# Patient Record
Sex: Male | Born: 1965 | Hispanic: No | Marital: Single | Smoking: Never smoker
Health system: Southern US, Community
[De-identification: ages and names within clinical notes are randomized; demographics above are authoritative.]

## PROBLEM LIST (undated history)

## (undated) HISTORY — PX: ABDOMINAL SURGERY: SHX537

## (undated) HISTORY — PX: OTHER SURGICAL HISTORY: SHX169

---

## 1999-05-31 ENCOUNTER — Emergency Department (HOSPITAL_COMMUNITY): Admission: EM | Admit: 1999-05-31 | Discharge: 1999-05-31 | Payer: Self-pay

## 1999-07-08 ENCOUNTER — Emergency Department (HOSPITAL_COMMUNITY): Admission: EM | Admit: 1999-07-08 | Discharge: 1999-07-08 | Payer: Self-pay | Admitting: Emergency Medicine

## 1999-07-08 ENCOUNTER — Encounter: Payer: Self-pay | Admitting: Emergency Medicine

## 2000-11-18 ENCOUNTER — Encounter: Payer: Self-pay | Admitting: Emergency Medicine

## 2000-11-18 ENCOUNTER — Emergency Department (HOSPITAL_COMMUNITY): Admission: EM | Admit: 2000-11-18 | Discharge: 2000-11-18 | Payer: Self-pay | Admitting: Emergency Medicine

## 2000-11-24 ENCOUNTER — Encounter: Admission: RE | Admit: 2000-11-24 | Discharge: 2000-11-24 | Payer: Self-pay | Admitting: Family Medicine

## 2000-11-25 ENCOUNTER — Emergency Department (HOSPITAL_COMMUNITY): Admission: EM | Admit: 2000-11-25 | Discharge: 2000-11-25 | Payer: Self-pay | Admitting: Emergency Medicine

## 2000-11-25 ENCOUNTER — Encounter: Payer: Self-pay | Admitting: Emergency Medicine

## 2006-04-08 ENCOUNTER — Emergency Department (HOSPITAL_COMMUNITY): Admission: EM | Admit: 2006-04-08 | Discharge: 2006-04-08 | Payer: Self-pay | Admitting: Emergency Medicine

## 2006-05-14 ENCOUNTER — Emergency Department (HOSPITAL_COMMUNITY): Admission: EM | Admit: 2006-05-14 | Discharge: 2006-05-14 | Payer: Self-pay | Admitting: Emergency Medicine

## 2006-09-10 ENCOUNTER — Emergency Department (HOSPITAL_COMMUNITY): Admission: EM | Admit: 2006-09-10 | Discharge: 2006-09-10 | Payer: Self-pay | Admitting: Emergency Medicine

## 2006-09-13 ENCOUNTER — Inpatient Hospital Stay (HOSPITAL_COMMUNITY): Admission: EM | Admit: 2006-09-13 | Discharge: 2006-09-16 | Payer: Self-pay | Admitting: Emergency Medicine

## 2006-09-17 ENCOUNTER — Ambulatory Visit: Payer: Self-pay | Admitting: Gastroenterology

## 2006-10-09 ENCOUNTER — Emergency Department (HOSPITAL_COMMUNITY): Admission: EM | Admit: 2006-10-09 | Discharge: 2006-10-10 | Payer: Self-pay | Admitting: Emergency Medicine

## 2006-10-21 ENCOUNTER — Emergency Department (HOSPITAL_COMMUNITY): Admission: EM | Admit: 2006-10-21 | Discharge: 2006-10-22 | Payer: Self-pay | Admitting: Emergency Medicine

## 2010-03-03 ENCOUNTER — Emergency Department (HOSPITAL_COMMUNITY)
Admission: EM | Admit: 2010-03-03 | Discharge: 2010-03-03 | Payer: Self-pay | Source: Home / Self Care | Admitting: Emergency Medicine

## 2010-05-12 LAB — COMPREHENSIVE METABOLIC PANEL
ALT: 85 U/L — ABNORMAL HIGH (ref 0–53)
AST: 109 U/L — ABNORMAL HIGH (ref 0–37)
Alkaline Phosphatase: 78 U/L (ref 39–117)
CO2: 23 mEq/L (ref 19–32)
Chloride: 100 mEq/L (ref 96–112)
Creatinine, Ser: 0.87 mg/dL (ref 0.4–1.5)
GFR calc Af Amer: >60 mL/min (ref >60)
GFR calc non Af Amer: >60 mL/min (ref >60)
Potassium: 3.9 mEq/L (ref 3.5–5.1)
Total Bilirubin: 0.8 mg/dL (ref 0.3–1.2)

## 2010-05-12 LAB — LIPASE, BLOOD: Lipase: 29 U/L (ref 11–59)

## 2010-05-12 LAB — DIFFERENTIAL
Basophils Absolute: 0 10*3/uL (ref 0.0–0.1)
Basophils Relative: 1 % (ref 0–1)
Eosinophils Absolute: 0 10*3/uL (ref 0.0–0.7)
Eosinophils Relative: 0 % (ref 0–5)
Lymphocytes Relative: 12 % (ref 12–46)
Monocytes Absolute: 0.4 10*3/uL (ref 0.1–1.0)

## 2010-05-12 LAB — CBC
Hemoglobin: 12.7 g/dL — ABNORMAL LOW (ref 13.0–17.0)
MCH: 27 pg (ref 26.0–34.0)
RBC: 4.71 MIL/uL (ref 4.22–5.81)
WBC: 5.6 10*3/uL (ref 4.0–10.5)
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
## 2010-07-15 NOTE — Discharge Summary (Signed)
NAME:  Joseph Mcconnell, Joseph Mcconnell NO.:  [id number]   MEDICAL RECORD NO.:  [id number]          PATIENT TYPE:  INP   LOCATION:  1536                         FACILITY:  [facility]   PHYSICIAN:  Hettie Holstein, D.O.    DATE OF BIRTH:  [birth year]   DATE OF ADMISSION:  09/12/2006  DATE OF DISCHARGE:                               DISCHARGE SUMMARY   PRIMARY CARE PHYSICIAN:  Unassigned.   GENERAL SURGEON:  Dr. Luretha Murphy.   GASTROENTEROLOGIST:  [facility], Dr. Rob Bunting.   FINAL DIAGNOSES:  1. Pancreatitis with cholelithiasis.  2. Alcohol abuse and dependency.  3. Alcohol withdrawal; the patient remained well-controlled with      Ativan taper.  He exhibits understanding of importance for      abstinence, as this may be contributing to his episode of      pancreatitis.  4. Stable thrombocytopenia.  5. Fatty liver.  6. Elevated transaminases that have trended down from AST of 156 and      ALT of 119 down to an AST of 87 and an ALT of 113.  His total      bilirubin at time of discharge was 1.1 and alkaline phosphatase was      58.  His lipase did trend down from 177 to 153, though      symptomatically he appeared to have resolved quite well to the      point that he was pain-free.  7. Subcentimeter pulmonary nodules that warrant outpatient followup to      be coordinated through a primary physician in 6 months' time.   MEDICATIONS ON DISCHARGE:  He is instructed to take Valium 5 mg p.o.  b.i.d. p.r.n.; he is prescribed #30.  No other medications are  prescribed.   DISPOSITION:  He is instructed to follow up with Dr. Janee Morn on October 06, 2006 at 9:30 a.m., where he is to arrange for elective laparoscopic  cholecystectomy and at that time, if indicated,  Dr. Christella Hartigan of [facility] will be involved, depending on the intraoperative cholangiogram.   HISTORY OF PRESENTING ILLNESS:  For full details, please refer to the  H&P as dictated by Dr. Della Goo.  However,  briefly, Mr.  Mcconnell is a 45 year old male with no known medical history, who  presented with complaints of severe abdominal pain and right upper  quadrant pain the afternoon prior to admission.  He stated the pain  started 2 hours after eating.  He states that it was unremitting with a  7/10 at worst.  He also had some associated nausea and vomiting.  He  denied any other associated complaints including chest pain or shortness  of breath, or diarrhea.   HOSPITAL COURSE:  He was admitted and it was noted that his ultrasound  in the emergency department revealed gallstones.  Gastroenterology was  consulted.  His lipase was elevated and he was seen by General Surgery  as well.  A CT scan of his abdomen and pelvis was performed with  findings consistent with pancreatitis without absence of pseudocyst or  phlegmon or abscess.  There was some cholelithiasis without  cholecystitis.  He had some shortness of breath as well as elevated D-  dimer; therefore, he underwent a CT scan that revealed no evidence of  pulmonary embolus.  The radiologist did recommend, however, 6-12 months  for followup of 2 right lung subcentimeter nodules.  As well, he was  noted to have fatty infiltration of the liver.  Joseph Mcconnell diet  was advanced slowly to liquids and he tolerated this well.  He was seen  by Gastroenterology, who recommended following concurrently if he  undergoes laparoscopic cholecystectomy.  He was counseled against  alcohol abuse and as noted above, I am attempting to coordinate a  followup appointment with a new primary care physician.  I will contact  Dr. Greggory Stallion Osei-Bonsu to follow up on the radiologist's recommendations  based on his CT scan.      Hettie Holstein, D.O.  Electronically Signed     ESS/MEDQ  D:  09/16/2006  T:  09/16/2006  Job:  [id number]   cc:   [facility]   Jackie Plum, M.D.  Fax: [phone]   [place] Daphine Deutscher, MD  [address]  [address]  [place]  [place] [address]   Rachael Fee, MD  [address]  [place], [place] [address]
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
## 2010-12-12 LAB — DIFFERENTIAL
Basophils Absolute: 0
Basophils Relative: 1
Lymphocytes Relative: 34
Monocytes Absolute: 0.4
Neutro Abs: 2

## 2010-12-12 LAB — BASIC METABOLIC PANEL
BUN: 4 — ABNORMAL LOW
CO2: 25
Calcium: 8.4
Creatinine, Ser: 0.82
GFR calc non Af Amer: >60
Glucose, Bld: 99

## 2010-12-12 LAB — CBC
MCHC: 35
Platelets: 187
RDW: 16.8 — ABNORMAL HIGH

## 2010-12-15 LAB — BASIC METABOLIC PANEL
CO2: 27
Calcium: 8.7
Chloride: 100
GFR calc Af Amer: >60
Glucose, Bld: 101 — ABNORMAL HIGH
Potassium: 3.5
Sodium: 136

## 2010-12-15 LAB — HEPATIC FUNCTION PANEL
AST: 168 — ABNORMAL HIGH
Bilirubin, Direct: 0.2
Indirect Bilirubin: 0.9
Total Bilirubin: 1.1

## 2010-12-15 LAB — COMPREHENSIVE METABOLIC PANEL
ALT: 113 — ABNORMAL HIGH
AST: 72 — ABNORMAL HIGH
Albumin: 3.5
Albumin: 3.5
Alkaline Phosphatase: 58
BUN: 5 — ABNORMAL LOW
BUN: 4 — ABNORMAL LOW
Chloride: 106
Chloride: 99
Creatinine, Ser: 0.78
GFR calc Af Amer: >60
Glucose, Bld: 106 — ABNORMAL HIGH
Potassium: 3.4 — ABNORMAL LOW
Sodium: 131 — ABNORMAL LOW
Total Bilirubin: 1.1
Total Protein: 7.1
Total Protein: 7.3

## 2010-12-15 LAB — DIFFERENTIAL
Basophils Relative: 0
Eosinophils Relative: 2
Eosinophils Relative: 3
Lymphocytes Relative: 28
Lymphs Abs: 1.4
Monocytes Absolute: 0.6
Monocytes Absolute: 0.4
Monocytes Relative: 12 — ABNORMAL HIGH
Monocytes Relative: 8
Neutro Abs: 3
Neutro Abs: 3.9

## 2010-12-15 LAB — CBC
HCT: 35.9 — ABNORMAL LOW
HCT: 39.3
Hemoglobin: 13.8
MCHC: 35.1
MCV: 89.1
MCV: 89
Platelets: 110 — ABNORMAL LOW
RBC: 4.41
RDW: 16.9 — ABNORMAL HIGH
RDW: 15.8 — ABNORMAL HIGH
WBC: 5.2

## 2010-12-15 LAB — SAMPLE TO BLOOD BANK

## 2010-12-15 LAB — ETHANOL: Alcohol, Ethyl (B): 311 — ABNORMAL HIGH

## 2010-12-16 LAB — DIFFERENTIAL
Basophils Absolute: 0
Basophils Absolute: 0
Basophils Relative: 0
Eosinophils Absolute: 0
Eosinophils Relative: 1
Lymphocytes Relative: 27
Lymphs Abs: 1
Monocytes Absolute: 0.5
Monocytes Relative: 12 — ABNORMAL HIGH
Neutro Abs: 2.3
Neutro Abs: 3.4
Neutrophils Relative %: 61

## 2010-12-16 LAB — HEPATITIS PANEL, ACUTE
Hep A IgM: NEGATIVE
Hep B C IgM: NEGATIVE
Hepatitis B Surface Ag: NEGATIVE

## 2010-12-16 LAB — URINALYSIS, ROUTINE W REFLEX MICROSCOPIC
Glucose, UA: NEGATIVE
Hgb urine dipstick: NEGATIVE
Specific Gravity, Urine: 1.03
Urobilinogen, UA: 1
pH: 6.5

## 2010-12-16 LAB — COMPREHENSIVE METABOLIC PANEL
BUN: 6
CO2: 24
CO2: 29
Calcium: 7.8 — ABNORMAL LOW
Calcium: 9.4
Chloride: 103
Creatinine, Ser: 0.77
Creatinine, Ser: 0.76
GFR calc Af Amer: >60
GFR calc non Af Amer: >60
GFR calc non Af Amer: >60
Glucose, Bld: 96
Total Bilirubin: 0.9

## 2010-12-16 LAB — BASIC METABOLIC PANEL
BUN: 9
CO2: 26
Calcium: 8.5
Chloride: 103
Creatinine, Ser: 0.8
GFR calc Af Amer: >60
Glucose, Bld: 144 — ABNORMAL HIGH

## 2010-12-16 LAB — CBC
HCT: 38.2 — ABNORMAL LOW
Hemoglobin: 13.6
MCHC: 33.1
MCHC: 35.5
MCV: 89.8
MCV: 88.4
MCV: 89.8
Platelets: 138 — ABNORMAL LOW
RBC: 4.09 — ABNORMAL LOW
RBC: 4.64
RDW: 16.1 — ABNORMAL HIGH
RDW: 14.8 — ABNORMAL HIGH
WBC: 3.7 — ABNORMAL LOW

## 2010-12-16 LAB — HEPATIC FUNCTION PANEL
Albumin: 3.7
Alkaline Phosphatase: 57
Indirect Bilirubin: 1.1 — ABNORMAL HIGH
Total Protein: 7

## 2010-12-16 LAB — RAPID URINE DRUG SCREEN, HOSP PERFORMED
Amphetamines: NOT DETECTED
Barbiturates: NOT DETECTED
Barbiturates: NOT DETECTED
Benzodiazepines: NOT DETECTED
Opiates: NOT DETECTED

## 2010-12-16 LAB — URINE MICROSCOPIC-ADD ON: Urine-Other: NONE SEEN

## 2010-12-16 LAB — CK TOTAL AND CKMB (NOT AT ARMC): Total CK: 443 — ABNORMAL HIGH

## 2010-12-16 LAB — TROPONIN I: Troponin I: 0.02

## 2010-12-16 LAB — LIPASE, BLOOD: Lipase: 56

## 2010-12-16 LAB — ETHANOL: Alcohol, Ethyl (B): 389 — ABNORMAL HIGH

## 2010-12-16 LAB — D-DIMER, QUANTITATIVE: D-Dimer, Quant: 1.14 — ABNORMAL HIGH

## 2017-08-08 ENCOUNTER — Emergency Department (HOSPITAL_COMMUNITY): Payer: Self-pay

## 2017-08-08 ENCOUNTER — Emergency Department (HOSPITAL_COMMUNITY)
Admission: EM | Admit: 2017-08-08 | Discharge: 2017-08-09 | Disposition: A | Discharge status: Home / Self Care | Payer: Self-pay | Attending: Emergency Medicine | Admitting: Emergency Medicine

## 2017-08-08 ENCOUNTER — Encounter (HOSPITAL_COMMUNITY): Payer: Self-pay | Admitting: Emergency Medicine

## 2017-08-08 DIAGNOSIS — M25562 Pain in left knee: Secondary | ICD-10-CM | POA: Insufficient documentation

## 2017-08-08 MED ORDER — IBUPROFEN 800 MG PO TABS
800.0000 mg | ORAL_TABLET | Freq: Once | ORAL | Status: AC
  Administered 2017-08-08: 800 mg via ORAL
  Filled 2017-08-08: qty 1

## 2017-08-08 MED ORDER — IBUPROFEN 800 MG PO TABS: 800.0000 mg | ORAL_TABLET | Freq: Three times a day (TID) | ORAL | 0 refills | Status: AC
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
## 2017-08-08 NOTE — Discharge Instructions (Addendum)
You may take ibuprofen 3 times a daily for knee swelling and pain.  You can wear the knee sleeve for comfort and if you are going to be on your feet for a long time. Remove to sleep.  Call orthopedic doctor, Dr. Roda ShuttersXu, to schedule an appointment if knee pain persists more than 1 month.

## 2017-08-08 NOTE — ED Triage Notes (Signed)
Pt presents to eD for 3 weeks of elft knee pain.  Swelling noted at triage.  Twisted 3 years ago playing basketball, but no known injuries at this time

## 2017-08-09 NOTE — ED Notes (Signed)
Pt and family understood dc material. NAD Noted. Script given at dc 

## 2018-07-09 IMAGING — DX DG KNEE COMPLETE 4+V*L*
4 series · 4 of 4 positions shown · non-contrast
Comparison: None.

CLINICAL DATA: Three weeks of left knee pain and swelling.

EXAM:
LEFT KNEE - COMPLETE 4+ VIEW

[knee ap]
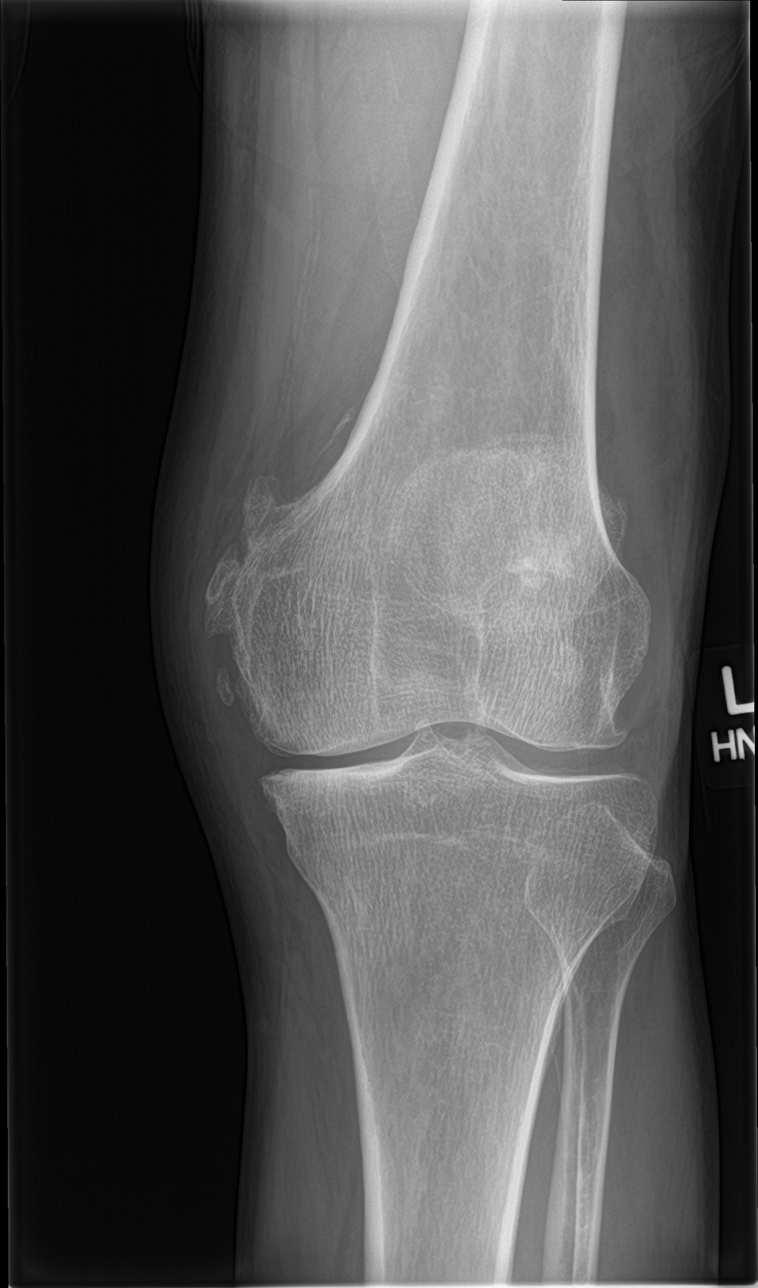

[knee lat]
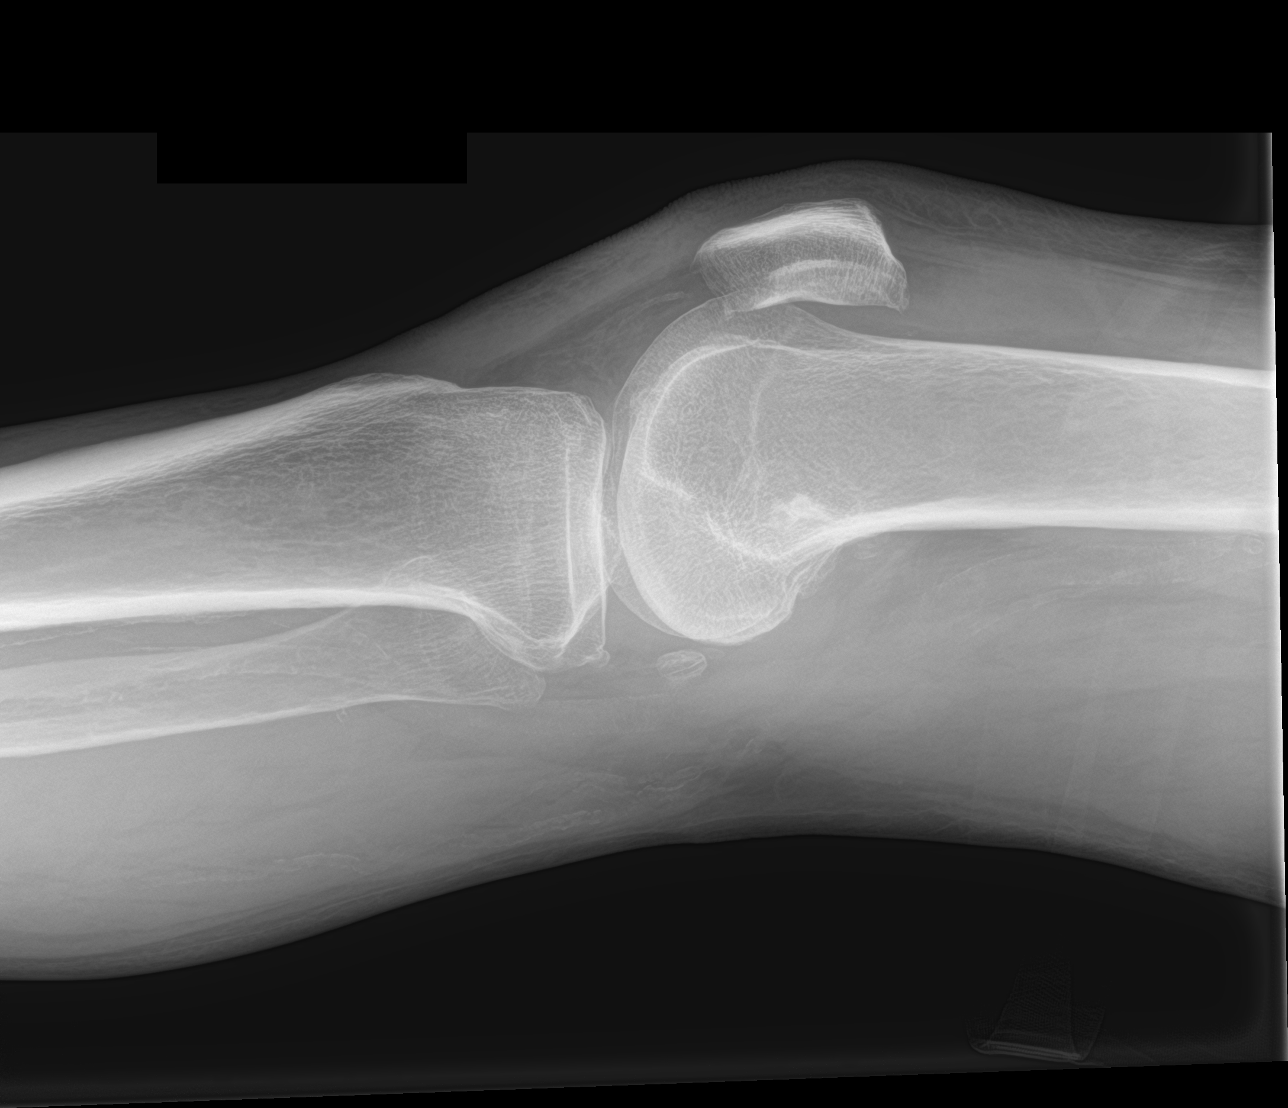

[knee obl (1 of 2)]
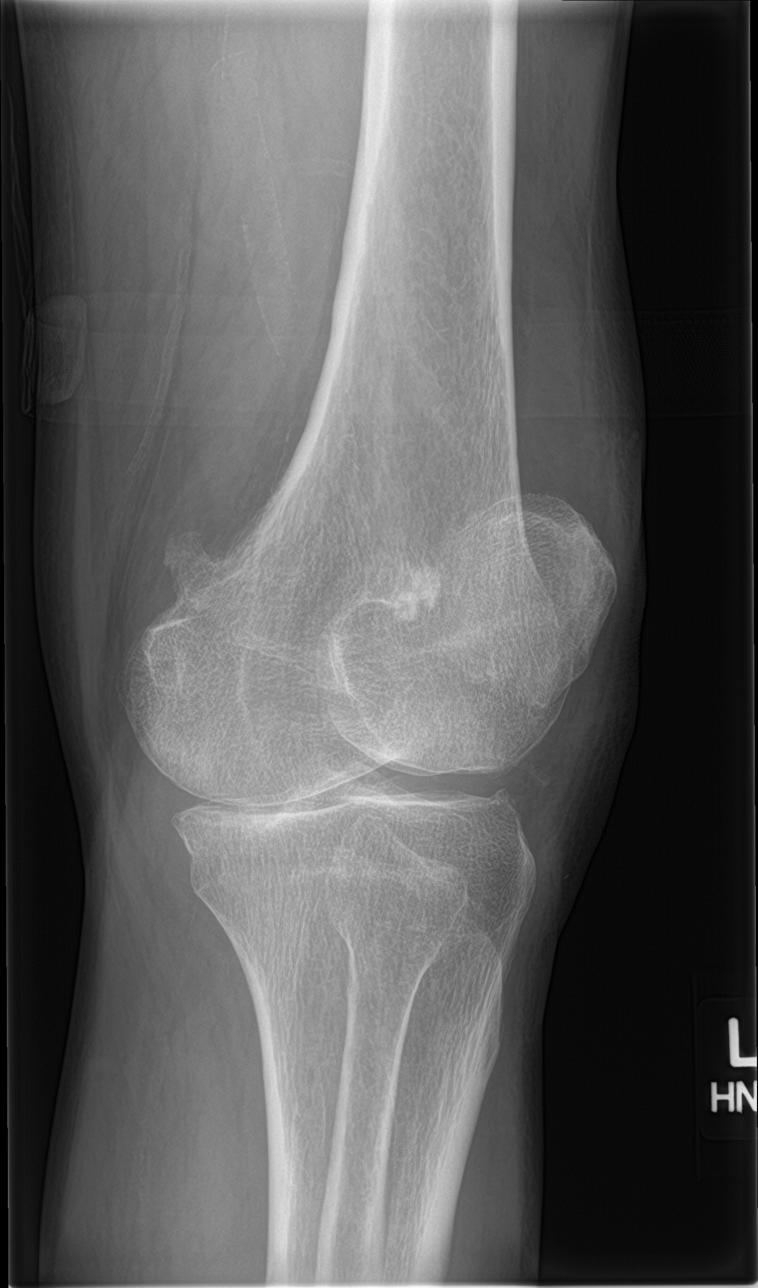

[knee obl (2 of 2)]
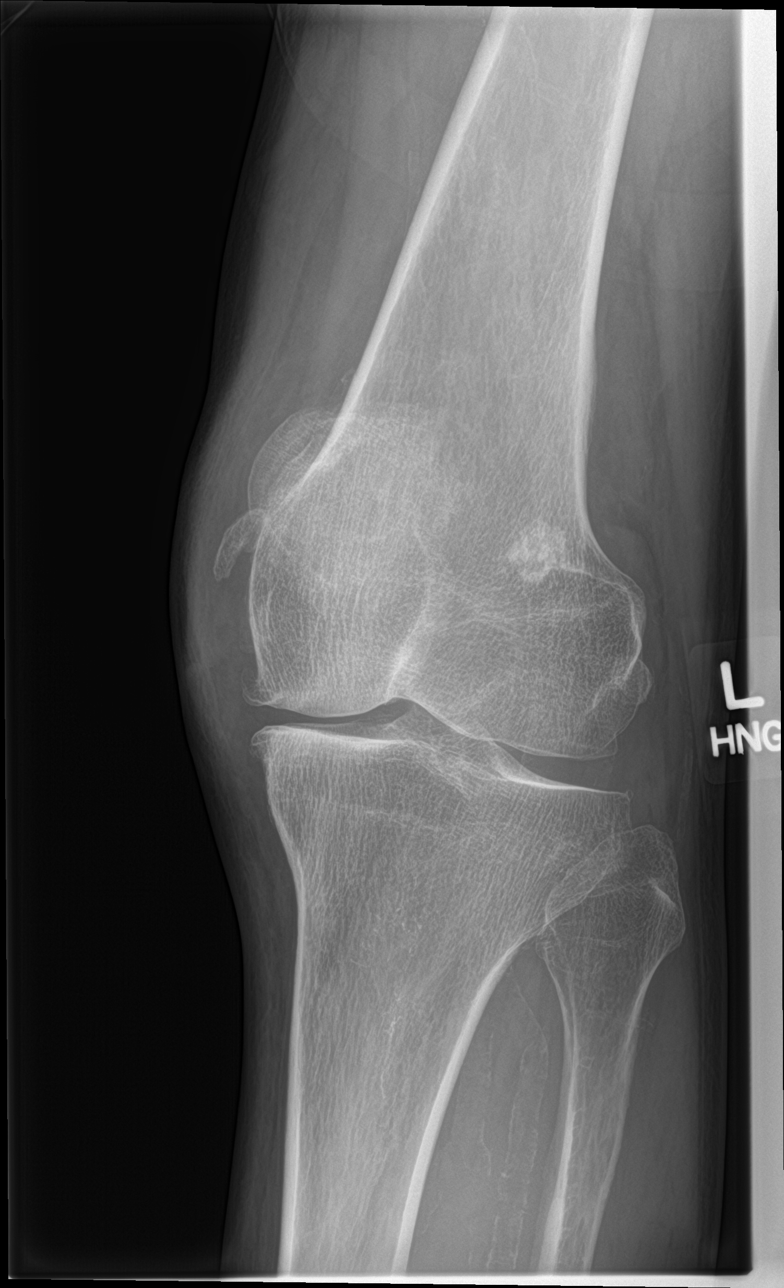

[4 of 4 positions shown; findings below may reference images not displayed]

FINDINGS: Femorotibial joint space narrowing with chondrocalcinosis and
spurring consistent with osteoarthritis. Degenerative changes noted
along the course of the MCL with spurring off the medial femoral
condyle. Popliteal arteriosclerosis is noted. No acute fracture or
joint effusion. No bone destruction or suspicious osseous
abnormalities. Mild soft tissue swelling is seen along the
anteromedial aspect of the knee.
IMPRESSION: Osteoarthritis of the left knee predominantly involving the
femorotibial compartment with soft tissue swelling. No acute osseous
abnormality nor joint effusion.
